# Patient Record
Sex: Female | Born: 1975 | Race: White | Hispanic: No | Marital: Married | Smoking: Never smoker
Health system: Southern US, Community
[De-identification: ages and names within clinical notes are randomized; demographics above are authoritative.]

## PROBLEM LIST (undated history)

## (undated) DIAGNOSIS — K219 Gastro-esophageal reflux disease without esophagitis: Secondary | ICD-10-CM

## (undated) HISTORY — DX: Gastro-esophageal reflux disease without esophagitis: K21.9

## (undated) HISTORY — PX: TUBAL LIGATION: SHX77

---

## 2009-01-23 ENCOUNTER — Ambulatory Visit: Payer: Self-pay | Admitting: Family Medicine

## 2009-09-15 ENCOUNTER — Ambulatory Visit: Payer: Self-pay | Admitting: Internal Medicine

## 2014-06-17 ENCOUNTER — Encounter: Payer: Self-pay | Admitting: *Deleted

## 2015-10-08 ENCOUNTER — Ambulatory Visit: Payer: Self-pay | Admitting: Physician Assistant

## 2015-12-08 ENCOUNTER — Other Ambulatory Visit: Payer: Self-pay | Admitting: Obstetrics and Gynecology

## 2015-12-08 DIAGNOSIS — Z1239 Encounter for other screening for malignant neoplasm of breast: Secondary | ICD-10-CM

## 2015-12-08 DIAGNOSIS — Z0000 Encounter for general adult medical examination without abnormal findings: Secondary | ICD-10-CM | POA: Diagnosis not present

## 2015-12-08 DIAGNOSIS — Z1389 Encounter for screening for other disorder: Secondary | ICD-10-CM | POA: Diagnosis not present

## 2015-12-08 DIAGNOSIS — Z01419 Encounter for gynecological examination (general) (routine) without abnormal findings: Secondary | ICD-10-CM | POA: Diagnosis not present

## 2015-12-30 ENCOUNTER — Ambulatory Visit: Payer: Self-pay

## 2015-12-30 ENCOUNTER — Ambulatory Visit
Admission: RE | Admit: 2015-12-30 | Discharge: 2015-12-30 | Disposition: A | Discharge status: Home / Self Care | Payer: 59 | Source: Ambulatory Visit | Attending: Obstetrics and Gynecology | Admitting: Obstetrics and Gynecology

## 2015-12-30 ENCOUNTER — Other Ambulatory Visit: Payer: Self-pay | Admitting: Obstetrics and Gynecology

## 2015-12-30 DIAGNOSIS — Z1239 Encounter for other screening for malignant neoplasm of breast: Secondary | ICD-10-CM

## 2015-12-30 DIAGNOSIS — Z1231 Encounter for screening mammogram for malignant neoplasm of breast: Secondary | ICD-10-CM | POA: Insufficient documentation

## 2016-12-21 ENCOUNTER — Other Ambulatory Visit: Payer: Self-pay | Admitting: Obstetrics and Gynecology

## 2016-12-21 DIAGNOSIS — Z01419 Encounter for gynecological examination (general) (routine) without abnormal findings: Secondary | ICD-10-CM | POA: Diagnosis not present

## 2016-12-21 DIAGNOSIS — Z1231 Encounter for screening mammogram for malignant neoplasm of breast: Secondary | ICD-10-CM

## 2017-01-13 ENCOUNTER — Ambulatory Visit
Admission: RE | Admit: 2017-01-13 | Discharge: 2017-01-13 | Disposition: A | Discharge status: Home / Self Care | Payer: 59 | Source: Ambulatory Visit | Attending: Obstetrics and Gynecology | Admitting: Obstetrics and Gynecology

## 2017-01-13 DIAGNOSIS — Z1231 Encounter for screening mammogram for malignant neoplasm of breast: Secondary | ICD-10-CM | POA: Diagnosis not present

## 2017-12-27 DIAGNOSIS — Z01419 Encounter for gynecological examination (general) (routine) without abnormal findings: Secondary | ICD-10-CM | POA: Diagnosis not present

## 2017-12-27 DIAGNOSIS — Z30011 Encounter for initial prescription of contraceptive pills: Secondary | ICD-10-CM | POA: Diagnosis not present

## 2017-12-28 ENCOUNTER — Other Ambulatory Visit: Payer: Self-pay | Admitting: Obstetrics and Gynecology

## 2017-12-28 DIAGNOSIS — Z1231 Encounter for screening mammogram for malignant neoplasm of breast: Secondary | ICD-10-CM

## 2018-01-16 ENCOUNTER — Ambulatory Visit
Admission: RE | Admit: 2018-01-16 | Discharge: 2018-01-16 | Disposition: A | Discharge status: Home / Self Care | Payer: 59 | Source: Ambulatory Visit | Attending: Obstetrics and Gynecology | Admitting: Obstetrics and Gynecology

## 2018-01-16 ENCOUNTER — Encounter (INDEPENDENT_AMBULATORY_CARE_PROVIDER_SITE_OTHER): Payer: Self-pay

## 2018-01-16 DIAGNOSIS — Z1231 Encounter for screening mammogram for malignant neoplasm of breast: Secondary | ICD-10-CM | POA: Diagnosis not present

## 2018-02-06 ENCOUNTER — Telehealth: Payer: 59 | Admitting: Family

## 2018-02-06 DIAGNOSIS — J208 Acute bronchitis due to other specified organisms: Secondary | ICD-10-CM

## 2018-02-06 DIAGNOSIS — B9689 Other specified bacterial agents as the cause of diseases classified elsewhere: Secondary | ICD-10-CM | POA: Diagnosis not present

## 2018-02-06 DIAGNOSIS — R05 Cough: Secondary | ICD-10-CM

## 2018-02-06 DIAGNOSIS — R059 Cough, unspecified: Secondary | ICD-10-CM

## 2018-02-06 MED ORDER — DOXYCYCLINE HYCLATE 100 MG PO TABS: 100.0000 mg | ORAL_TABLET | Freq: Two times a day (BID) | ORAL | 0 refills | Status: DC

## 2018-02-06 MED ORDER — PREDNISONE 10 MG (21) PO TBPK: ORAL_TABLET | ORAL | 0 refills | Status: DC

## 2018-02-06 NOTE — Progress Notes (Signed)
We are sorry that you are not feeling well.  Here is how we plan to help!  Based on your presentation I believe you most likely have A cough due to bacteria.  When patients have a fever and a productive cough with a change in color or increased sputum production, we are concerned about bacterial bronchitis.  If left untreated it can progress to pneumonia.  If your symptoms do not improve with your treatment plan it is important that you contact your provider.   I have prescribed Doxycycline 100 mg twice a day for 7 days     In addition you may use A non-prescription cough medication called Robitussin DAC. Take 2 teaspoons every 8 hours or Delsym: take 2 teaspoons every 12 hours. and A non-prescription cough medication called Mucinex DM: take 2 tablets every 12 hours.  Prednisone 10 mg daily for 6 days (see taper instructions below)  Directions for 6 day taper: Day 1: 2 tablets before breakfast, 1 after both lunch & dinner and 2 at bedtime Day 2: 1 tab before breakfast, 1 after both lunch & dinner and 2 at bedtime Day 3: 1 tab at each meal & 1 at bedtime Day 4: 1 tab at breakfast, 1 at lunch, 1 at bedtime Day 5: 1 tab at breakfast & 1 tab at bedtime Day 6: 1 tab at breakfast   From your responses in the eVisit questionnaire you describe inflammation in the upper respiratory tract which is causing a significant cough.  This is commonly called Bronchitis and has four common causes:    Allergies  Viral Infections  Acid Reflux  Bacterial Infection Allergies, viruses and acid reflux are treated by controlling symptoms or eliminating the cause. An example might be a cough caused by taking certain blood pressure medications. You stop the cough by changing the medication. Another example might be a cough caused by acid reflux. Controlling the reflux helps control the cough.  USE OF BRONCHODILATOR ("RESCUE") INHALERS: There is a risk from using your bronchodilator too frequently.  The risk is that  over-reliance on a medication which only relaxes the muscles surrounding the breathing tubes can reduce the effectiveness of medications prescribed to reduce swelling and congestion of the tubes themselves.  Although you feel brief relief from the bronchodilator inhaler, your asthma may actually be worsening with the tubes becoming more swollen and filled with mucus.  This can delay other crucial treatments, such as oral steroid medications. If you need to use a bronchodilator inhaler daily, several times per day, you should discuss this with your provider.  There are probably better treatments that could be used to keep your asthma under control.     HOME CARE . Only take medications as instructed by your medical team. . Complete the entire course of an antibiotic. . Drink plenty of fluids and get plenty of rest. . Avoid close contacts especially the very young and the elderly . Cover your mouth if you cough or cough into your sleeve. . Always remember to wash your hands . A steam or ultrasonic humidifier can help congestion.   GET HELP RIGHT AWAY IF: . You develop worsening fever. . You become short of breath . You cough up blood. . Your symptoms persist after you have completed your treatment plan MAKE SURE YOU   Understand these instructions.  Will watch your condition.  Will get help right away if you are not doing well or get worse.  Your e-visit answers were reviewed by  a board certified advanced clinical practitioner to complete your personal care plan.  Depending on the condition, your plan could have included both over the counter or prescription medications. If there is a problem please reply  once you have received a response from your provider. Your safety is important to us.  If you have drug allergies check your prescription carefully.    You can use MyChart to ask questions about today's visit, request a non-urgent call back, or ask for a work or school excuse for 24 hours  related to this e-Visit. If it has been greater than 24 hours you will need to follow up with your provider, or enter a new e-Visit to address those concerns. You will get an e-mail in the next two days asking about your experience.  I hope that your e-visit has been valuable and will speed your recovery. Thank you for using e-visits.

## 2019-01-15 ENCOUNTER — Other Ambulatory Visit: Payer: Self-pay | Admitting: Certified Nurse Midwife

## 2019-01-15 DIAGNOSIS — Z1151 Encounter for screening for human papillomavirus (HPV): Secondary | ICD-10-CM | POA: Diagnosis not present

## 2019-01-15 DIAGNOSIS — N920 Excessive and frequent menstruation with regular cycle: Secondary | ICD-10-CM | POA: Diagnosis not present

## 2019-01-15 DIAGNOSIS — R87619 Unspecified abnormal cytological findings in specimens from cervix uteri: Secondary | ICD-10-CM | POA: Diagnosis not present

## 2019-01-15 DIAGNOSIS — Z01419 Encounter for gynecological examination (general) (routine) without abnormal findings: Secondary | ICD-10-CM | POA: Diagnosis not present

## 2019-01-15 DIAGNOSIS — R87610 Atypical squamous cells of undetermined significance on cytologic smear of cervix (ASC-US): Secondary | ICD-10-CM | POA: Diagnosis not present

## 2019-01-15 DIAGNOSIS — Z13220 Encounter for screening for lipoid disorders: Secondary | ICD-10-CM | POA: Diagnosis not present

## 2019-01-15 DIAGNOSIS — Z131 Encounter for screening for diabetes mellitus: Secondary | ICD-10-CM | POA: Diagnosis not present

## 2019-01-15 DIAGNOSIS — Z1231 Encounter for screening mammogram for malignant neoplasm of breast: Secondary | ICD-10-CM

## 2019-01-30 ENCOUNTER — Ambulatory Visit
Admission: RE | Admit: 2019-01-30 | Discharge: 2019-01-30 | Disposition: A | Discharge status: Home / Self Care | Payer: 59 | Source: Ambulatory Visit | Attending: Certified Nurse Midwife | Admitting: Certified Nurse Midwife

## 2019-01-30 ENCOUNTER — Other Ambulatory Visit: Payer: Self-pay

## 2019-01-30 DIAGNOSIS — Z1231 Encounter for screening mammogram for malignant neoplasm of breast: Secondary | ICD-10-CM | POA: Diagnosis not present

## 2019-02-06 ENCOUNTER — Other Ambulatory Visit: Payer: Self-pay | Admitting: Certified Nurse Midwife

## 2019-02-06 DIAGNOSIS — N6489 Other specified disorders of breast: Secondary | ICD-10-CM

## 2019-02-06 DIAGNOSIS — R928 Other abnormal and inconclusive findings on diagnostic imaging of breast: Secondary | ICD-10-CM

## 2019-02-09 ENCOUNTER — Ambulatory Visit
Admission: RE | Admit: 2019-02-09 | Discharge: 2019-02-09 | Disposition: A | Discharge status: Home / Self Care | Payer: 59 | Source: Ambulatory Visit | Attending: Certified Nurse Midwife | Admitting: Certified Nurse Midwife

## 2019-02-09 DIAGNOSIS — R928 Other abnormal and inconclusive findings on diagnostic imaging of breast: Secondary | ICD-10-CM | POA: Insufficient documentation

## 2019-02-09 DIAGNOSIS — N6489 Other specified disorders of breast: Secondary | ICD-10-CM | POA: Diagnosis not present

## 2019-02-14 DIAGNOSIS — R87610 Atypical squamous cells of undetermined significance on cytologic smear of cervix (ASC-US): Secondary | ICD-10-CM | POA: Diagnosis not present

## 2019-02-14 DIAGNOSIS — B977 Papillomavirus as the cause of diseases classified elsewhere: Secondary | ICD-10-CM | POA: Diagnosis not present

## 2019-02-14 DIAGNOSIS — R87619 Unspecified abnormal cytological findings in specimens from cervix uteri: Secondary | ICD-10-CM | POA: Diagnosis not present

## 2019-02-14 DIAGNOSIS — N711 Chronic inflammatory disease of uterus: Secondary | ICD-10-CM | POA: Diagnosis not present

## 2019-02-14 DIAGNOSIS — N72 Inflammatory disease of cervix uteri: Secondary | ICD-10-CM | POA: Diagnosis not present

## 2019-06-28 DIAGNOSIS — Z20828 Contact with and (suspected) exposure to other viral communicable diseases: Secondary | ICD-10-CM | POA: Diagnosis not present

## 2019-06-28 DIAGNOSIS — Z03818 Encounter for observation for suspected exposure to other biological agents ruled out: Secondary | ICD-10-CM | POA: Diagnosis not present

## 2019-07-01 DIAGNOSIS — U071 COVID-19: Secondary | ICD-10-CM | POA: Diagnosis not present

## 2019-07-01 DIAGNOSIS — Z20828 Contact with and (suspected) exposure to other viral communicable diseases: Secondary | ICD-10-CM | POA: Diagnosis not present

## 2020-04-29 ENCOUNTER — Other Ambulatory Visit: Payer: Self-pay | Admitting: Obstetrics and Gynecology

## 2020-07-01 DIAGNOSIS — Z1231 Encounter for screening mammogram for malignant neoplasm of breast: Secondary | ICD-10-CM | POA: Diagnosis not present

## 2020-07-01 DIAGNOSIS — Z01419 Encounter for gynecological examination (general) (routine) without abnormal findings: Secondary | ICD-10-CM | POA: Diagnosis not present

## 2020-07-01 DIAGNOSIS — Z1329 Encounter for screening for other suspected endocrine disorder: Secondary | ICD-10-CM | POA: Diagnosis not present

## 2020-07-01 DIAGNOSIS — R87610 Atypical squamous cells of undetermined significance on cytologic smear of cervix (ASC-US): Secondary | ICD-10-CM | POA: Diagnosis not present

## 2020-07-01 DIAGNOSIS — Z124 Encounter for screening for malignant neoplasm of cervix: Secondary | ICD-10-CM | POA: Diagnosis not present

## 2020-07-01 DIAGNOSIS — Z131 Encounter for screening for diabetes mellitus: Secondary | ICD-10-CM | POA: Diagnosis not present

## 2020-07-01 DIAGNOSIS — Z13220 Encounter for screening for lipoid disorders: Secondary | ICD-10-CM | POA: Diagnosis not present

## 2020-07-17 ENCOUNTER — Other Ambulatory Visit: Payer: Self-pay | Admitting: Certified Nurse Midwife

## 2020-07-17 DIAGNOSIS — Z1231 Encounter for screening mammogram for malignant neoplasm of breast: Secondary | ICD-10-CM

## 2020-07-21 ENCOUNTER — Ambulatory Visit
Admission: RE | Admit: 2020-07-21 | Discharge: 2020-07-21 | Disposition: A | Discharge status: Home / Self Care | Payer: 59 | Source: Ambulatory Visit | Attending: Certified Nurse Midwife | Admitting: Certified Nurse Midwife

## 2020-07-21 ENCOUNTER — Other Ambulatory Visit: Payer: Self-pay

## 2020-07-21 DIAGNOSIS — Z1231 Encounter for screening mammogram for malignant neoplasm of breast: Secondary | ICD-10-CM | POA: Insufficient documentation

## 2021-01-09 ENCOUNTER — Emergency Department: Payer: 59

## 2021-01-09 ENCOUNTER — Other Ambulatory Visit: Payer: Self-pay

## 2021-01-09 ENCOUNTER — Encounter: Payer: Self-pay | Admitting: Emergency Medicine

## 2021-01-09 ENCOUNTER — Emergency Department
Admission: EM | Admit: 2021-01-09 | Discharge: 2021-01-09 | Disposition: A | Discharge status: Home / Self Care | Payer: 59 | Attending: Emergency Medicine | Admitting: Emergency Medicine

## 2021-01-09 DIAGNOSIS — R42 Dizziness and giddiness: Secondary | ICD-10-CM | POA: Diagnosis not present

## 2021-01-09 DIAGNOSIS — Z20822 Contact with and (suspected) exposure to covid-19: Secondary | ICD-10-CM | POA: Insufficient documentation

## 2021-01-09 DIAGNOSIS — R002 Palpitations: Secondary | ICD-10-CM | POA: Diagnosis not present

## 2021-01-09 DIAGNOSIS — R110 Nausea: Secondary | ICD-10-CM | POA: Insufficient documentation

## 2021-01-09 DIAGNOSIS — R000 Tachycardia, unspecified: Secondary | ICD-10-CM | POA: Diagnosis not present

## 2021-01-09 LAB — CBC
HCT: 38.9 % (ref 36.0–46.0)
Hemoglobin: 13.1 g/dL (ref 12.0–15.0)
MCH: 28.9 pg (ref 26.0–34.0)
MCHC: 33.7 g/dL (ref 30.0–36.0)
MCV: 85.7 fL (ref 80.0–100.0)
Platelets: 425 10*3/uL — ABNORMAL HIGH (ref 150–400)
RBC: 4.54 MIL/uL (ref 3.87–5.11)
RDW: 12.2 % (ref 11.5–15.5)
WBC: 14.4 10*3/uL — ABNORMAL HIGH (ref 4.0–10.5)
nRBC: 0 % (ref 0.0–0.2)

## 2021-01-09 LAB — BASIC METABOLIC PANEL
Anion gap: 9 (ref 5–15)
BUN: 12 mg/dL (ref 6–20)
CO2: 25 mmol/L (ref 22–32)
Calcium: 9.3 mg/dL (ref 8.9–10.3)
Chloride: 105 mmol/L (ref 98–111)
Creatinine, Ser: 0.73 mg/dL (ref 0.44–1.00)
GFR, Estimated: >60 mL/min (ref >60)
Glucose, Bld: 128 mg/dL — ABNORMAL HIGH (ref 70–99)
Potassium: 3.5 mmol/L (ref 3.5–5.1)
Sodium: 139 mmol/L (ref 135–145)

## 2021-01-09 LAB — RESP PANEL BY RT-PCR (FLU A&B, COVID) ARPGX2
Influenza A by PCR: NEGATIVE
Influenza B by PCR: NEGATIVE
SARS Coronavirus 2 by RT PCR: NEGATIVE

## 2021-01-09 LAB — TROPONIN I (HIGH SENSITIVITY)
Troponin I (High Sensitivity): <2 ng/L (ref <18)
Troponin I (High Sensitivity): <2 ng/L (ref <18)

## 2021-01-09 LAB — D-DIMER, QUANTITATIVE: D-Dimer, Quant: 0.3 ug/mL-FEU (ref 0.00–0.50)

## 2021-01-09 MED ORDER — LACTATED RINGERS IV BOLUS
1000.0000 mL | Freq: Once | INTRAVENOUS | Status: AC
  Administered 2021-01-09: 1000 mL via INTRAVENOUS

## 2021-01-09 MED ORDER — ONDANSETRON 4 MG PO TBDP
4.0000 mg | ORAL_TABLET | Freq: Three times a day (TID) | ORAL | 0 refills | Status: DC | PRN
  Filled 2021-01-09: qty 20, 7d supply, fill #0

## 2021-01-09 NOTE — ED Provider Notes (Signed)
[facility] Emergency Department Provider Note ____________________________________________   Event Date/Time   First MD Initiated Contact with Patient 01/09/21 [phone]     (approximate)  I have reviewed the triage vital signs and the nursing notes.  HISTORY  Chief Complaint Palpitations and Dizziness   HPI Jessica Franklin is a 45 y.o. femalewho presents to the ED for evaluation of dizziness and palpitations.   Chart review indicates no relevant history.  Patient presents to the ED, accompanied by her good friend and work Animator, for evaluation of dizziness and palpitations.  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  She reports feeling "off" throughout this week with nausea, poor appetite and poor p.o. intake.  Denies abdominal pain, emesis, diarrhea or fever.  She reports poor sleep and excessive caffeine use while working night shifts last night.  At work last night, around 3 AM, she reports developing palpitations and presyncopal dizziness after standing.  She checked her heart rate on 5-lead telemetry and reports rates up to 130.  She shows me on her phone a rhythm strip from her smart watch demonstrating what appears to be sinus tachycardic rhythm with a rate of 101 bpm.  Due to palpitations and this heart rate while at work this morning, she presents to the ED for evaluation.  She denies any chest pain, syncope, shortness of breath, falls or injuries.  Reports feeling okay right now.  History reviewed. No pertinent past medical history.  There are no problems to display for this patient.   Past Surgical History:  Procedure Laterality Date   TUBAL LIGATION      Prior to Admission medications   Medication Sig Start Date End Date Taking? Authorizing Provider  ondansetron (ZOFRAN ODT) 4 MG disintegrating tablet Take 1 tablet (4 mg total) by mouth every 8 (eight) hours as needed for nausea or vomiting. 01/09/21  Yes Delton Prairie, MD   doxycycline (VIBRA-TABS) 100 MG tablet Take 1 tablet (100 mg total) by mouth 2 (two) times daily. 02/06/18   Junie Spencer, FNP  predniSONE (STERAPRED UNI-PAK 21 TAB) 10 MG (21) TBPK tablet Use as directed 02/06/18   Junie Spencer, FNP    Allergies Penicillins  Family History  Problem Relation Age of Onset   Breast cancer Neg Hx     Social History Social History   Tobacco Use   Smoking status: Never   Smokeless tobacco: Never  Vaping Use   Vaping Use: Every day  Substance Use Topics   Alcohol use: Yes   Drug use: Not Currently    Review of Systems  Constitutional: No fever/chills.  Positive for dizziness Eyes: No visual changes. ENT: No sore throat. Cardiovascular: Denies chest pain.  Positive for palpitations Respiratory: Denies shortness of breath. Gastrointestinal: No abdominal pain.  No nausea, no vomiting.  No diarrhea.  No constipation. Genitourinary: Negative for dysuria. Musculoskeletal: Negative for back pain. Skin: Negative for rash. Neurological: Negative for headaches, focal weakness or numbness.  ____________________________________________   PHYSICAL EXAM:  VITAL SIGNS: Vitals:   01/09/21 1030 01/09/21 1100  BP: 120/83 115/77  Pulse: 90 86  Resp: 15 13  Temp:    SpO2: 97% 98%    Constitutional: Alert and oriented. Well appearing and in no acute distress. Eyes: Conjunctivae are normal. PERRL. EOMI. Head: Atraumatic. Nose: No congestion/rhinnorhea. Mouth/Throat: Mucous membranes are moist.  Oropharynx non-erythematous. Neck: No stridor. No cervical spine tenderness to palpation. Cardiovascular: Normal rate, regular rhythm. Grossly normal heart sounds.  Good peripheral circulation. Respiratory: Normal respiratory effort.  No retractions. Lungs CTAB. Gastrointestinal: Soft , nondistended, nontender to palpation. No CVA tenderness. Musculoskeletal: No lower extremity tenderness nor edema.  No joint effusions. No signs of acute  trauma. Neurologic:  Normal speech and language. No gross focal neurologic deficits are appreciated. No gait instability noted. Skin:  Skin is warm, dry and intact. No rash noted. Psychiatric: Mood and affect are normal. Speech and behavior are normal. ____________________________________________   LABS (all labs ordered are listed, but only abnormal results are displayed)  Labs Reviewed  BASIC METABOLIC PANEL - Abnormal; Notable for the following components:      Result Value   Glucose, Bld 128 (*)    All other components within normal limits  CBC - Abnormal; Notable for the following components:   WBC 14.4 (*)    Platelets 425 (*)    All other components within normal limits  RESP PANEL BY RT-PCR (FLU A&B, COVID) [id number]  D-DIMER, QUANTITATIVE  POC URINE PREG, ED  TROPONIN I (HIGH SENSITIVITY)  TROPONIN I (HIGH SENSITIVITY)   ____________________________________________  12 Lead EKG  Sinus tachycardic rhythm with a rate of 108 bpm.  Normal axis and intervals.  T wave inversions isolated to lead III.  No further evidence of acute ischemia. ____________________________________________  RADIOLOGY  ED MD interpretation:  CXR reviewed by me without evidence of acute cardiopulmonary pathology.  Official radiology report(s): DG Chest 2 View  Result Date: 01/09/2021 CLINICAL DATA:  45 year old female with history of heart palpitations. EXAM: CHEST - 2 VIEW COMPARISON:  No priors. FINDINGS: Lung volumes are normal. No consolidative airspace disease. No pleural effusions. No pneumothorax. No pulmonary nodule or mass noted. Pulmonary vasculature and the cardiomediastinal silhouette are within normal limits. IMPRESSION: No radiographic evidence of acute cardiopulmonary disease. Electronically Signed   By: Trudie Reed M.D.   On: 01/09/2021 07:44    ____________________________________________   PROCEDURES and INTERVENTIONS  Procedure(s) performed (including Critical  Care):  .1-3 Lead EKG Interpretation Performed by: Delton Prairie, MD Authorized by: Delton Prairie, MD     Interpretation: normal     ECG rate:  92   ECG rate assessment: normal     Rhythm: sinus rhythm     Ectopy: none     Conduction: normal    Medications  lactated ringers bolus 1,000 mL (1,000 mLs Intravenous New Bag/Given 01/09/21 0956)    ____________________________________________   MDM / ED COURSE   Healthy 45 year old female presents to the ED with palpitations, without evidence of acute pathology, and amenable to outpatient management.  Normal vitals on room air.  She is clinically well-appearing without evidence of acute pathology on examination.  No signs of neurologic or vascular deficits.  No chest pain.  No evidence of ACS and D-dimer rules out acute PE in this low risk patient.  No signs of underlying COVID or flu to cause her to feel poorly with poor appetite and nausea.  We will discharge with referral to cardiology for possible Holter monitor.  Discussed return precautions for the ED.  Clinical Course as of 01/09/21 1133  Fri Jan 09, 2021  1130 Reassessed.  Remains asymptomatic.  No dysrhythmias noted on telemetry.  I explained benign work-up to her and discussed outpatient management, referral to cardiology to discuss Holter monitor and return precautions for the ED. [DS]    Clinical Course User Index [DS] Delton Prairie, MD    ____________________________________________   FINAL CLINICAL IMPRESSION(S) / ED DIAGNOSES  Final diagnoses:  Palpitations  Dizziness  Nausea     ED Discharge Orders          Ordered    ondansetron (ZOFRAN ODT) 4 MG disintegrating tablet  Every 8 hours PRN        01/09/21 1126             Eliyah Bazzi   Note:  This document was prepared using Conservation officer, historic buildings and may include unintentional dictation errors.    Delton Prairie, MD 01/09/21 1135

## 2021-01-09 NOTE — ED Triage Notes (Signed)
Pt comes into the ED via POV c/o palpitations and dizziness.  Pt states that she hasn't felt good all week and had an episode of palpitations the other day but is resolved.  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  Pt states she did have dizziness and nausea when this occurred.  Pt denies any known cardiac history.

## 2021-01-09 NOTE — ED Notes (Signed)
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 Pt began feeling heart palpitations, "fluttering" at 0300 when walking out of pt room and put her self on 5 lead monitor and it read SVT with HR in 130s.   Pt is still feeling some fluttering, but it feels "different". Pt has been experiencing nausea and dizziness on and off for 5-7 days and feels "waves of dizziness and nausea" intermittently today since 0300.   Has not been able to eat this week, did not eat anything at all on Sunday. Had sore throat 1 week ago (Friday) which resolved. Denies chest pain. Denies SOB. At this moment, pt states feels flutters and monitor reads NSR at 89 HR. States she drank 2 mountain dew sodas last night and had not eaten much.  Not pregnant, is on period now and had tubal ligation 20 years ago. Accompanied by friend who is nurse.

## 2021-01-13 ENCOUNTER — Other Ambulatory Visit: Payer: Self-pay

## 2021-01-13 ENCOUNTER — Ambulatory Visit
Admission: RE | Admit: 2021-01-13 | Discharge: 2021-01-13 | Disposition: A | Discharge status: Home / Self Care | Payer: 59 | Source: Ambulatory Visit | Attending: Internal Medicine | Admitting: Internal Medicine

## 2021-01-13 VITALS — BP 137/83 | HR 76 | Temp 98.6°F | Resp 16

## 2021-01-13 DIAGNOSIS — R42 Dizziness and giddiness: Secondary | ICD-10-CM

## 2021-01-13 DIAGNOSIS — H6993 Unspecified Eustachian tube disorder, bilateral: Secondary | ICD-10-CM

## 2021-01-13 DIAGNOSIS — H6983 Other specified disorders of Eustachian tube, bilateral: Secondary | ICD-10-CM

## 2021-01-13 LAB — URINALYSIS, COMPLETE (UACMP) WITH MICROSCOPIC
Bilirubin Urine: NEGATIVE
Glucose, UA: NEGATIVE mg/dL
Hgb urine dipstick: NEGATIVE
Ketones, ur: NEGATIVE mg/dL
Leukocytes,Ua: NEGATIVE
Nitrite: NEGATIVE
Protein, ur: NEGATIVE mg/dL
Specific Gravity, Urine: <1.005 — ABNORMAL LOW (ref 1.005–1.030)
pH: 6 (ref 5.0–8.0)

## 2021-01-13 MED ORDER — MECLIZINE HCL 25 MG PO TABS: 25.0000 mg | ORAL_TABLET | Freq: Three times a day (TID) | ORAL | 0 refills | Status: DC | PRN

## 2021-01-13 NOTE — ED Provider Notes (Signed)
[facility]    CSN: [id number] Arrival date & time: 01/13/21  1254      History   Chief Complaint Chief Complaint  Patient presents with   Dizziness   Otalgia   Appointment    1300     HPI Jessica Franklin is a 45 y.o. female presenting for dizziness for the past week or so.  Patient reports that she had cough, congestion and sore throat and general upper respiratory type symptoms right before the dizziness started.  She reports feeling like her head is swimmy and she feels lightheaded.  Nothing makes dizziness better or worse.  She did actually go to the emergency department 4 days ago for the dizziness.  Patient reports she started to experience palpitations and tachycardia.  She performed an EKG on herself while at work and says that it showed SVT.  She became concerned so she went immediately to the emergency department.  They performed an EKG which was normal and did thorough work-up and referred her to a cardiologist.  She has a cardiology appointment early next week.  Patient reports that the dizziness has improved but she is concerned because it has been ongoing for about a week.  She denies any ear pain or drainage.  No hearing changes or headaches.  Cough has resolved.  No chest pain or breathing difficulty.  Denies any recent palpitations.  Patient reports that she stopped drinking caffeine and vaping when she started to have the tachycardia.  Patient is otherwise healthy without any chronic medical problems.  She has no other complaints.  HPI  History reviewed. No pertinent past medical history.  There are no problems to display for this patient.   Past Surgical History:  Procedure Laterality Date   TUBAL LIGATION      OB History   No obstetric history on file.      Home Medications    Prior to Admission medications   Medication Sig Start Date End Date Taking? Authorizing Provider  meclizine (ANTIVERT) 25 MG tablet Take 1 tablet (25 mg total) by  mouth 3 (three) times daily as needed for dizziness. 01/13/21  Yes Shirlee Latch, PA-C    Family History Family History  Problem Relation Age of Onset   Breast cancer Neg Hx     Social History Social History   Tobacco Use   Smoking status: Never   Smokeless tobacco: Never  Vaping Use   Vaping Use: Former   Start date: 01/09/2021  Substance Use Topics   Alcohol use: Yes   Drug use: Not Currently     Allergies   Penicillins   Review of Systems Review of Systems  Constitutional:  Negative for chills, diaphoresis, fatigue and fever.  HENT:  Positive for congestion. Negative for ear pain, rhinorrhea, sinus pressure, sinus pain and sore throat.   Respiratory:  Negative for cough and shortness of breath.   Cardiovascular:  Negative for chest pain and palpitations.  Gastrointestinal:  Negative for abdominal pain, nausea and vomiting.  Genitourinary:  Negative for dysuria and frequency.  Musculoskeletal:  Negative for arthralgias and myalgias.  Skin:  Negative for rash.  Neurological:  Positive for dizziness and light-headedness. Negative for syncope, weakness, numbness and headaches.  Hematological:  Negative for adenopathy.    Physical Exam Triage Vital Signs ED Triage Vitals  Enc Vitals Group     BP 01/13/21 1320 137/83     Pulse Rate 01/13/21 1320 76     Resp 01/13/21 1320  16     Temp 01/13/21 1320 98.6 F (37 C)     Temp Source 01/13/21 1320 Oral     SpO2 01/13/21 1320 98 %     Weight --      Height --      Head Circumference --      Peak Flow --      Pain Score 01/13/21 1318 0     Pain Loc --      Pain Edu? --      Excl. in GC? --    No data found.  Updated Vital Signs BP 137/83 (BP Location: Left Arm)   Pulse 76   Temp 98.6 F (37 C) (Oral)   Resp 16   LMP 01/05/2021   SpO2 98%      Physical Exam Vitals and nursing note reviewed.  Constitutional:      General: She is not in acute distress.    Appearance: Normal appearance. She is not  ill-appearing or toxic-appearing.  HENT:     Head: Normocephalic and atraumatic.     Right Ear: Hearing, ear canal and external ear normal. A middle ear effusion is present. Tympanic membrane is not erythematous, retracted or bulging.     Left Ear: Hearing, ear canal and external ear normal. A middle ear effusion is present. Tympanic membrane is not erythematous, retracted or bulging.     Nose: Mucosal edema and congestion present.     Mouth/Throat:     Mouth: Mucous membranes are moist.     Pharynx: Oropharynx is clear.  Eyes:     General: No scleral icterus.       Right eye: No discharge.        Left eye: No discharge.     Conjunctiva/sclera: Conjunctivae normal.  Cardiovascular:     Rate and Rhythm: Normal rate and regular rhythm.     Heart sounds: Normal heart sounds.  Pulmonary:     Effort: Pulmonary effort is normal. No respiratory distress.     Breath sounds: Normal breath sounds.  Musculoskeletal:     Cervical back: Neck supple.  Skin:    General: Skin is dry.  Neurological:     General: No focal deficit present.     Mental Status: She is alert. Mental status is at baseline.     Motor: No weakness.     Gait: Gait normal.  Psychiatric:        Mood and Affect: Mood normal.        Behavior: Behavior normal.        Thought Content: Thought content normal.     UC Treatments / Results  Labs (all labs ordered are listed, but only abnormal results are displayed) Labs Reviewed  URINALYSIS, COMPLETE (UACMP) WITH MICROSCOPIC - Abnormal; Notable for the following components:      Result Value   Specific Gravity, Urine <1.005 (*)    Bacteria, UA RARE (*)    All other components within normal limits    EKG   Radiology No results found.  Procedures Procedures (including critical care time)  Medications Ordered in UC Medications - No data to display  Initial Impression / Assessment and Plan / UC Course  I have reviewed the triage vital signs and the nursing  notes.  Pertinent labs & imaging results that were available during my care of the patient were reviewed by me and considered in my medical decision making (see chart for details).  45 year old female presenting for approximately 1  week history of dizziness.  Patient had upper respiratory infection right before the dizziness started.  Patient seen in ED 4 days ago.  I was able to review the notes.  Patient had EKG, BMP, CBC, troponin, chest x-ray, D-dimer and respiratory panel.  Work-up only significant for elevated WBC count at 14.   Today, vital signs are normal and stable and she is overall well-appearing.  Exam does reveal effusions of bilateral TMs.  She also has minor nasal congestion.  Chest clear to auscultation heart regular rate and rhythm.  Patient says she did not have her ears checked at the ER few days ago.  Advised patient I believe her symptoms of dizziness are likely related to the effusions and eustachian tube dysfunction which is secondary to her viral upper respiratory infection.  Treating patient at this time with meclizine.  I have also advised nasal saline and Flonase.  Patient reported occasional abdominal cramping so a urinalysis was checked which was within normal limits.  Reviewed return and ED precautions with patient.  Final Clinical Impressions(s) / UC Diagnoses   Final diagnoses:  Dizziness  Dysfunction of both eustachian tubes     Discharge Instructions      -You have fluid behind both of your eardrums.  I am sure this is the cause of your dizziness.  Use Flonase every day.  I have sent meclizine which should help to dry up some of the fluid and help with the dizziness. - Increase rest and fluids. - Keep appointment with your cardiologist on Monday since you did have possible SVT. - You can do nicotine patches or gum if needed since you have stopped vaping. - If you experience any worsening of the dizziness, chest pain, palpitations, shortness of breath or  weakness he should be seen again immediately.  Please go to emergency department if that occurs.     ED Prescriptions     Medication Sig Dispense Auth. Provider   meclizine (ANTIVERT) 25 MG tablet Take 1 tablet (25 mg total) by mouth 3 (three) times daily as needed for dizziness. 30 tablet Gareth Morgan      PDMP not reviewed this encounter.   Shirlee Latch, PA-C 01/13/21 1424

## 2021-01-13 NOTE — Discharge Instructions (Signed)
-  You have fluid behind both of your eardrums.  I am sure this is the cause of your dizziness.  Use Flonase every day.  I have sent meclizine which should help to dry up some of the fluid and help with the dizziness. - Increase rest and fluids. - Keep appointment with your cardiologist on Monday since you did have possible SVT. - You can do nicotine patches or gum if needed since you have stopped vaping. - If you experience any worsening of the dizziness, chest pain, palpitations, shortness of breath or weakness he should be seen again immediately.  Please go to emergency department if that occurs.

## 2021-01-13 NOTE — ED Triage Notes (Addendum)
Patient presents to Urgent Care with complaints of continued dizziness  since last ED visit 11/04. Left ear ringing since Friday. Pt also states she has had some lower back pain and abdominal cramping believes related to ovulation.   Denies fever, hematuria, dysuria or cloudy urine.

## 2021-01-19 DIAGNOSIS — I471 Supraventricular tachycardia: Secondary | ICD-10-CM | POA: Diagnosis not present

## 2021-01-19 DIAGNOSIS — F17200 Nicotine dependence, unspecified, uncomplicated: Secondary | ICD-10-CM | POA: Diagnosis not present

## 2021-01-19 DIAGNOSIS — R002 Palpitations: Secondary | ICD-10-CM | POA: Diagnosis not present

## 2021-01-19 DIAGNOSIS — R000 Tachycardia, unspecified: Secondary | ICD-10-CM | POA: Diagnosis not present

## 2021-01-28 DIAGNOSIS — R002 Palpitations: Secondary | ICD-10-CM | POA: Diagnosis not present

## 2021-01-28 DIAGNOSIS — I471 Supraventricular tachycardia: Secondary | ICD-10-CM | POA: Diagnosis not present

## 2021-02-02 DIAGNOSIS — R002 Palpitations: Secondary | ICD-10-CM | POA: Diagnosis not present

## 2021-02-02 DIAGNOSIS — I471 Supraventricular tachycardia: Secondary | ICD-10-CM | POA: Diagnosis not present

## 2021-02-02 DIAGNOSIS — R000 Tachycardia, unspecified: Secondary | ICD-10-CM | POA: Diagnosis not present

## 2021-02-02 DIAGNOSIS — F17200 Nicotine dependence, unspecified, uncomplicated: Secondary | ICD-10-CM | POA: Diagnosis not present

## 2021-06-06 LAB — RESULTS CONSOLE HPV: CHL HPV: NEGATIVE

## 2021-06-06 LAB — HM PAP SMEAR: HM Pap smear: NORMAL

## 2021-07-29 DIAGNOSIS — R002 Palpitations: Secondary | ICD-10-CM | POA: Diagnosis not present

## 2021-07-29 DIAGNOSIS — R000 Tachycardia, unspecified: Secondary | ICD-10-CM | POA: Diagnosis not present

## 2021-07-29 DIAGNOSIS — F17200 Nicotine dependence, unspecified, uncomplicated: Secondary | ICD-10-CM | POA: Diagnosis not present

## 2021-07-29 DIAGNOSIS — R42 Dizziness and giddiness: Secondary | ICD-10-CM | POA: Diagnosis not present

## 2021-07-29 DIAGNOSIS — I471 Supraventricular tachycardia: Secondary | ICD-10-CM | POA: Diagnosis not present

## 2021-07-29 DIAGNOSIS — J069 Acute upper respiratory infection, unspecified: Secondary | ICD-10-CM | POA: Diagnosis not present

## 2021-08-09 IMAGING — MG MM DIGITAL DIAGNOSTIC UNILAT*R* W/ TOMO W/ CAD
4 series · 4 of 12 positions shown · non-contrast
Comparison: Previous exam(s).

CLINICAL DATA: Screening recall for a right breast asymmetry.

EXAM:
DIGITAL DIAGNOSTIC UNILATERAL RIGHT MAMMOGRAM WITH CAD AND TOMO

[R MLO synth-2D]
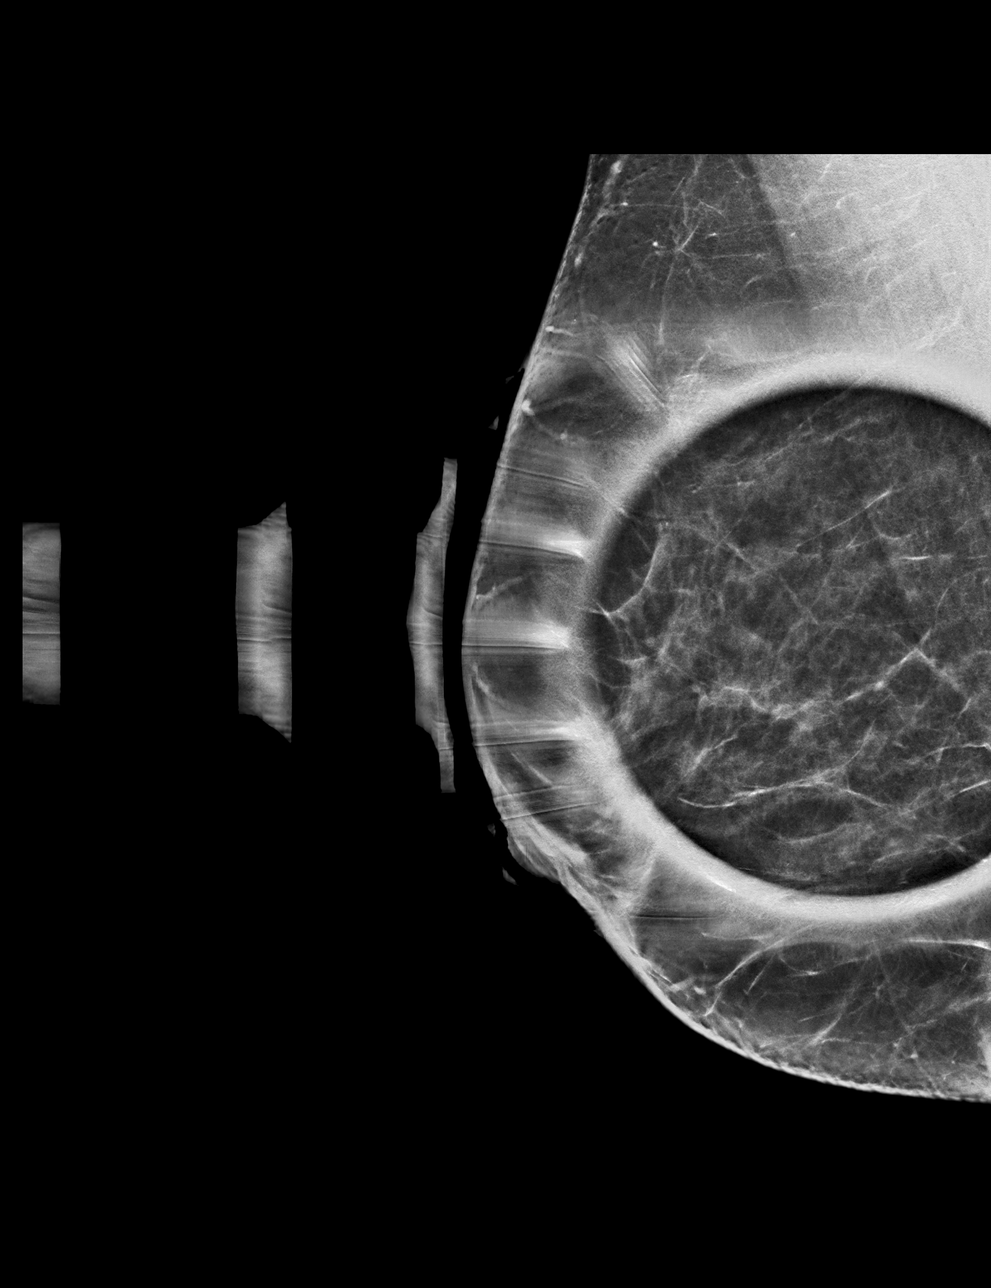

[R CC synth-2D]
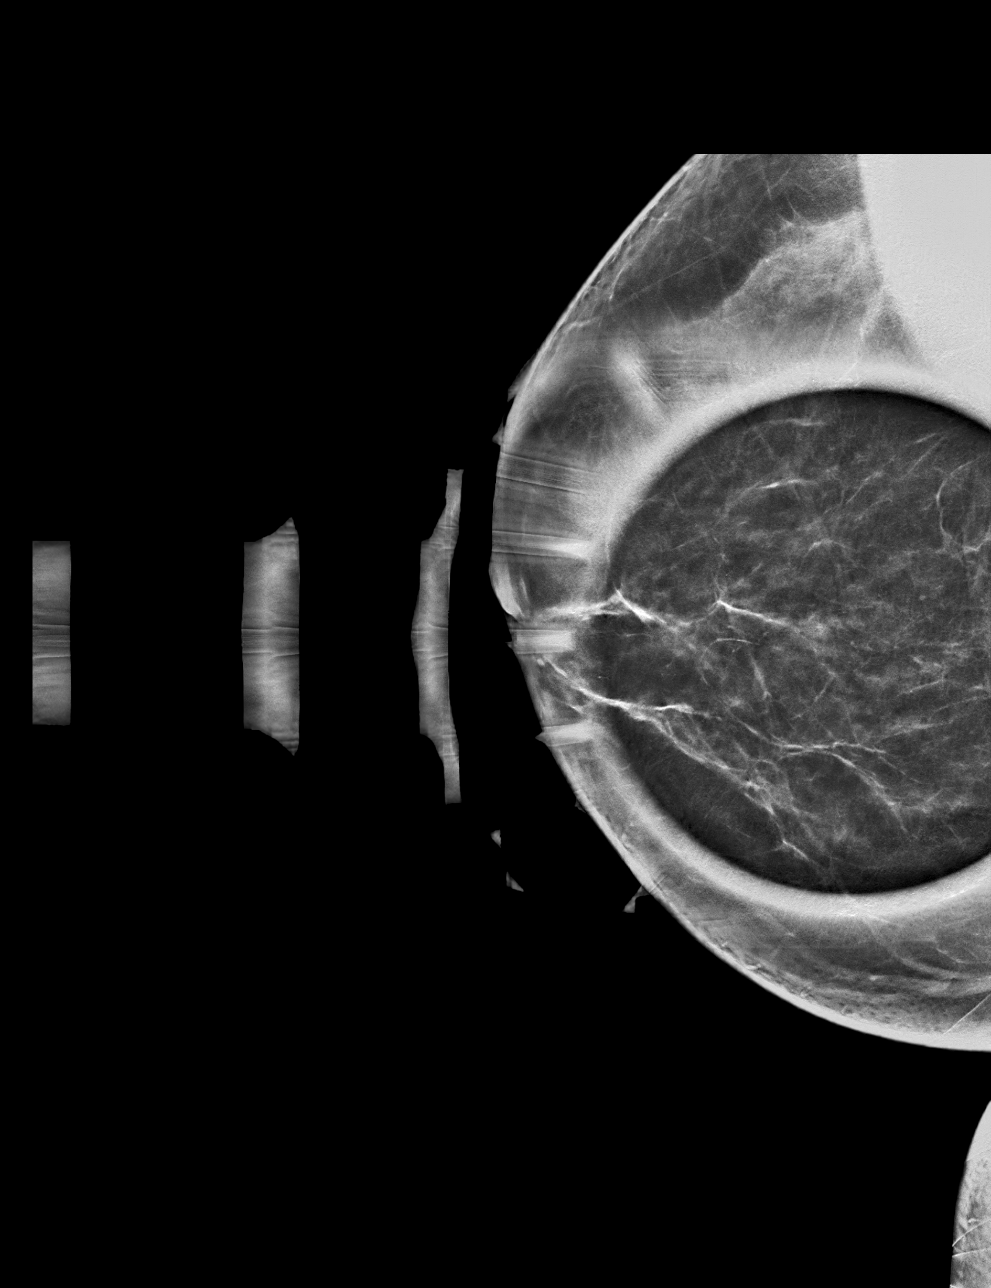

[R CC tomo · tomo slice 29/57.0]
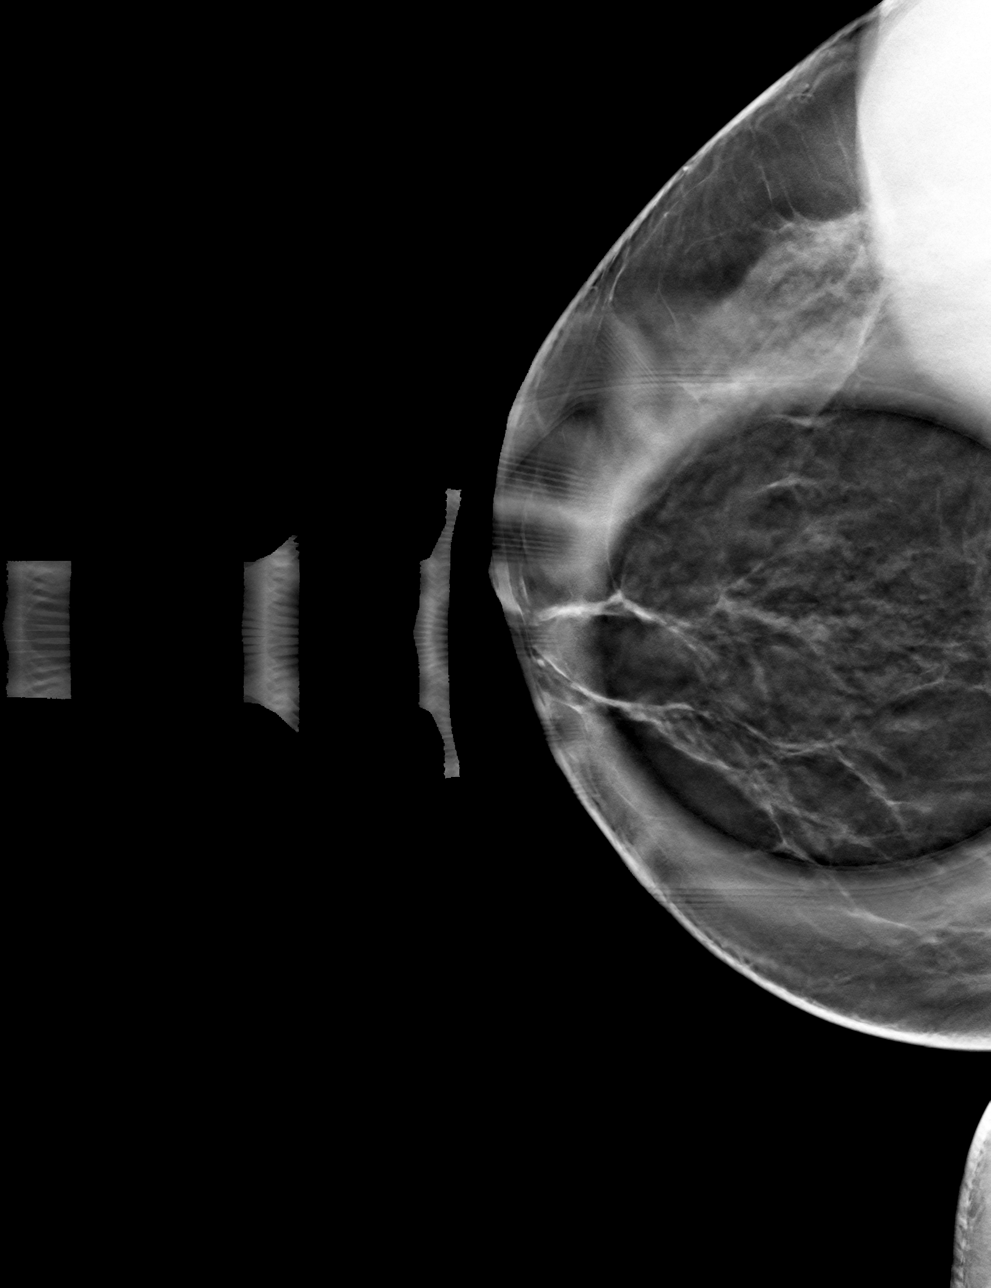

[R MLO tomo · tomo slice 31/62.0]
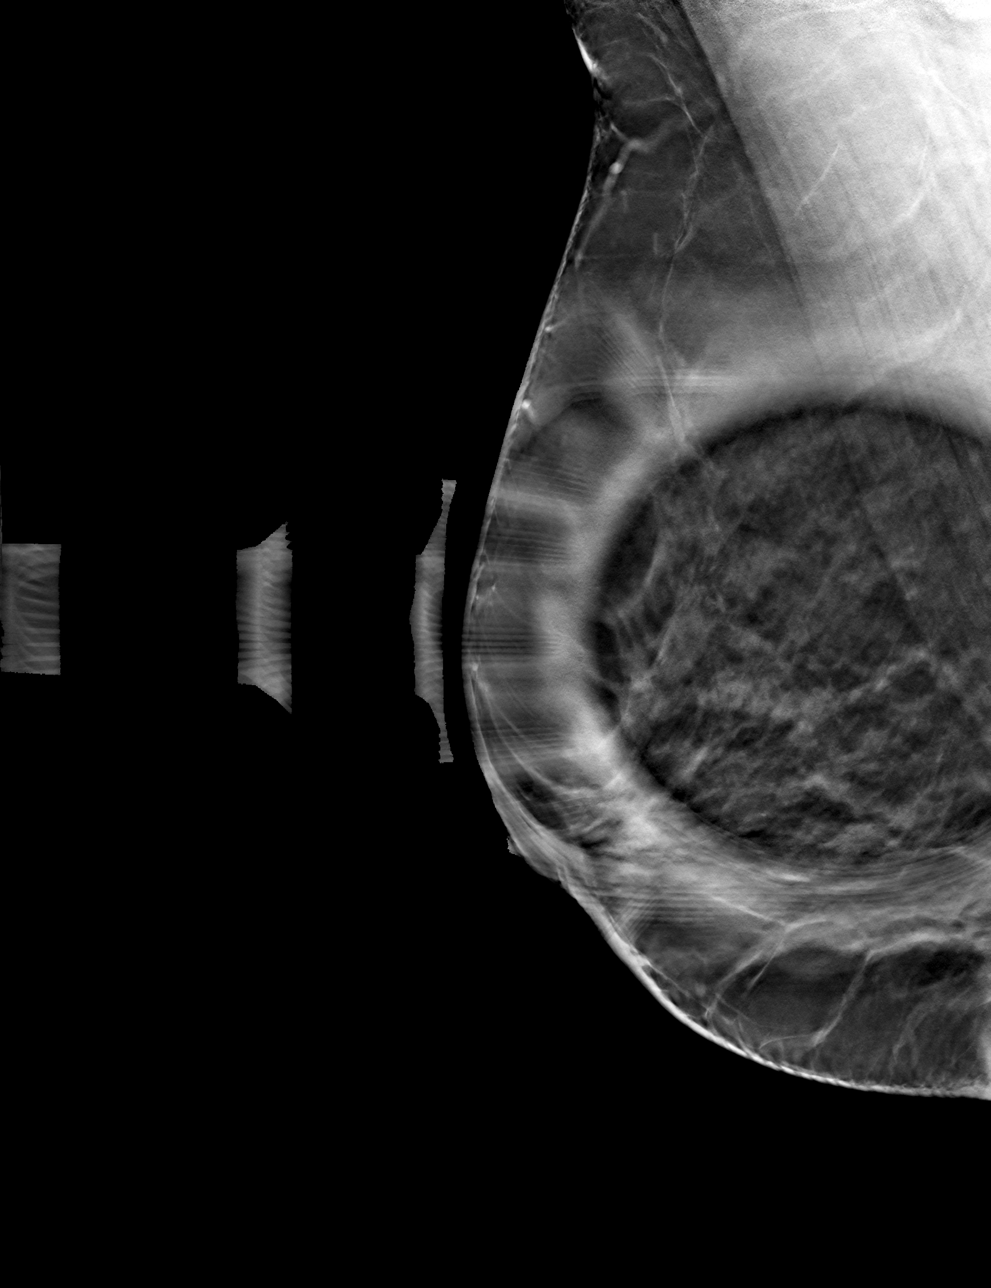

[4 of 12 positions shown; findings below may reference images not displayed]

ACR Breast Density Category c: The breast tissue is heterogeneously
dense, which may obscure small masses.
FINDINGS: The asymmetry in the central right breast resolves on spot
compression tomosynthesis imaging. No suspicious calcifications,
masses or areas of distortion are seen in the right breast.

Mammographic images were processed with CAD.
IMPRESSION: Resolution of the right breast asymmetry consistent with overlapping
fibroglandular tissue.

RECOMMENDATION:
Screening mammogram in one year.(Code:[id number]-U-[id number])

I have discussed the findings and recommendations with the patient.
If applicable, a reminder letter will be sent to the patient
regarding the next appointment.

BI-RADS CATEGORY  1: Negative.

## 2021-08-13 ENCOUNTER — Encounter: Payer: Self-pay | Admitting: Family Medicine

## 2021-08-13 ENCOUNTER — Ambulatory Visit: Payer: 59 | Admitting: Family Medicine

## 2021-08-13 VITALS — BP 128/70 | HR 80 | Ht 64.0 in | Wt 148.0 lb

## 2021-08-13 DIAGNOSIS — Z1231 Encounter for screening mammogram for malignant neoplasm of breast: Secondary | ICD-10-CM | POA: Diagnosis not present

## 2021-08-13 DIAGNOSIS — Z8679 Personal history of other diseases of the circulatory system: Secondary | ICD-10-CM | POA: Diagnosis not present

## 2021-08-13 NOTE — Progress Notes (Signed)
Date:  08/13/2021   Name:  Jessica Franklin   DOB:  [birth year]   MRN:  [id number]   Chief Complaint: No chief complaint on file.  Patient is a 46 year old female who presents for a comprehensive physical exam. The patient reports the following problems: . Health maintenance has been reviewed this mammogram reordered.      Lab Results  Component Value Date   NA 139 01/09/2021   K 3.5 01/09/2021   CO2 25 01/09/2021   GLUCOSE 128 (H) 01/09/2021   BUN 12 01/09/2021   CREATININE 0.73 01/09/2021   CALCIUM 9.3 01/09/2021   GFRNONAA >60 01/09/2021   No results found for: "CHOL", "HDL", "LDLCALC", "LDLDIRECT", "TRIG", "CHOLHDL" No results found for: "TSH" No results found for: "HGBA1C" Lab Results  Component Value Date   WBC 14.4 (H) 01/09/2021   HGB 13.1 01/09/2021   HCT 38.9 01/09/2021   MCV 85.7 01/09/2021   PLT 425 (H) 01/09/2021   No results found for: "ALT", "AST", "GGT", "ALKPHOS", "BILITOT" No results found for: "25OHVITD2", "25OHVITD3", "VD25OH"   Review of Systems  Constitutional:  Negative for diaphoresis, fatigue, fever and unexpected weight change.  HENT:  Negative for congestion, nosebleeds, postnasal drip, rhinorrhea, sinus pressure and sinus pain.   Respiratory:  Negative for chest tightness and shortness of breath.   Cardiovascular:  Negative for chest pain, palpitations and leg swelling.  Gastrointestinal:  Negative for abdominal pain, blood in stool, constipation and diarrhea.  Genitourinary:  Negative for menstrual problem and pelvic pain.    There are no problems to display for this patient.   Allergies  Allergen Reactions   Penicillins Hives    Past Surgical History:  Procedure Laterality Date   TUBAL LIGATION      Social History   Tobacco Use   Smoking status: Never   Smokeless tobacco: Never  Vaping Use   Vaping Use: Former   Start date: 01/09/2021  Substance Use Topics   Alcohol use: Yes   Drug use: Not Currently     Medication  list has been reviewed and updated.  No outpatient medications have been marked as taking for the 08/13/21 encounter (Appointment) with Duanne Limerick, MD.        No data to display              No data to display          BP Readings from Last 3 Encounters:  01/13/21 137/83  01/09/21 115/77    Physical Exam Vitals and nursing note reviewed.  HENT:     Right Ear: Tympanic membrane normal.     Left Ear: Tympanic membrane normal.     Nose: Nose normal. No congestion or rhinorrhea.  Cardiovascular:     Heart sounds: No murmur heard.    No gallop.  Pulmonary:     Effort: No respiratory distress.     Breath sounds: No stridor. No wheezing, rhonchi or rales.  Chest:     Chest wall: No tenderness.  Abdominal:     Tenderness: There is no abdominal tenderness. There is no guarding or rebound.     Wt Readings from Last 3 Encounters:  01/09/21 138 lb (62.6 kg)    There were no vitals taken for this visit.  Assessment and Plan: Patient establishing care with new physician.  Patient has up-to-date except is needing a mammogram and we have discussed ahead of time since she turns 46 this year about colonoscopy surveillance.  1. History of sinus tachycardia New onset.  Was noted last year.  Has episodic and was fully evaluated with echo and Holter monitor.  Recurrence has been rare and short-lived when they do occur.  We will continue to monitor and have an awareness of caffeine intake.  2. Breast cancer screening by mammogram Discussed with patient and mammogram will be reordered.` - MM 3D SCREEN BREAST BILATERAL

## 2021-09-09 ENCOUNTER — Ambulatory Visit
Admission: RE | Admit: 2021-09-09 | Discharge: 2021-09-09 | Disposition: A | Discharge status: Home / Self Care | Payer: 59 | Source: Ambulatory Visit | Attending: Family Medicine | Admitting: Family Medicine

## 2021-09-09 DIAGNOSIS — Z1231 Encounter for screening mammogram for malignant neoplasm of breast: Secondary | ICD-10-CM | POA: Diagnosis not present

## 2022-09-17 ENCOUNTER — Ambulatory Visit (INDEPENDENT_AMBULATORY_CARE_PROVIDER_SITE_OTHER): Payer: 59 | Admitting: Family Medicine

## 2022-09-17 ENCOUNTER — Other Ambulatory Visit: Payer: Self-pay

## 2022-09-17 ENCOUNTER — Encounter: Payer: Self-pay | Admitting: Family Medicine

## 2022-09-17 VITALS — BP 112/70 | HR 94 | Ht 64.0 in | Wt 148.0 lb

## 2022-09-17 DIAGNOSIS — B379 Candidiasis, unspecified: Secondary | ICD-10-CM | POA: Diagnosis not present

## 2022-09-17 DIAGNOSIS — Z1231 Encounter for screening mammogram for malignant neoplasm of breast: Secondary | ICD-10-CM

## 2022-09-17 DIAGNOSIS — Z1211 Encounter for screening for malignant neoplasm of colon: Secondary | ICD-10-CM

## 2022-09-17 DIAGNOSIS — R5383 Other fatigue: Secondary | ICD-10-CM | POA: Diagnosis not present

## 2022-09-17 DIAGNOSIS — Z0000 Encounter for general adult medical examination without abnormal findings: Secondary | ICD-10-CM

## 2022-09-17 MED ORDER — NYSTATIN 100000 UNIT/GM EX CREA
1.0000 | TOPICAL_CREAM | Freq: Two times a day (BID) | CUTANEOUS | 2 refills | Status: AC
  Filled 2022-09-17: qty 30, 30d supply, fill #0
  Filled 2023-02-16: qty 30, 30d supply, fill #1

## 2022-09-17 NOTE — Progress Notes (Signed)
Date:  09/17/2022   Name:  Jessica Franklin   DOB:  07/08/1975   MRN:  [id number]   Chief Complaint: Annual Exam (No pap)  Patient is a 47 year old female who presents for a comprehensive physical exam. The patient reports the following problems: none. Health maintenance has been reviewed up to date.      Lab Results  Component Value Date   NA 139 01/09/2021   K 3.5 01/09/2021   CO2 25 01/09/2021   GLUCOSE 128 (H) 01/09/2021   BUN 12 01/09/2021   CREATININE 0.73 01/09/2021   CALCIUM 9.3 01/09/2021   GFRNONAA >60 01/09/2021   No results found for: "CHOL", "HDL", "LDLCALC", "LDLDIRECT", "TRIG", "CHOLHDL" No results found for: "TSH" No results found for: "HGBA1C" Lab Results  Component Value Date   WBC 14.4 (H) 01/09/2021   HGB 13.1 01/09/2021   HCT 38.9 01/09/2021   MCV 85.7 01/09/2021   PLT 425 (H) 01/09/2021   No results found for: "ALT", "AST", "GGT", "ALKPHOS", "BILITOT" No results found for: "25OHVITD2", "25OHVITD3", "VD25OH"   Review of Systems  Constitutional:  Negative for chills and fever.  HENT:  Negative for drooling, ear discharge, ear pain and sore throat.   Respiratory:  Negative for cough, shortness of breath and wheezing.   Cardiovascular:  Negative for chest pain, palpitations and leg swelling.  Gastrointestinal:  Negative for abdominal pain, blood in stool, constipation, diarrhea and nausea.  Endocrine: Negative for polydipsia and polyuria.  Genitourinary:  Negative for difficulty urinating, dysuria, frequency, hematuria, menstrual problem and urgency.  Musculoskeletal:  Negative for back pain, myalgias and neck pain.  Skin:  Negative for rash.  Allergic/Immunologic: Negative for environmental allergies.  Neurological:  Negative for dizziness, numbness and headaches.  Hematological:  Negative for adenopathy. Does not bruise/bleed easily.  Psychiatric/Behavioral:  Negative for suicidal ideas. The patient is not nervous/anxious.     There are no  problems to display for this patient.   Allergies  Allergen Reactions   Penicillins Hives    Past Surgical History:  Procedure Laterality Date   TUBAL LIGATION      Social History   Tobacco Use   Smoking status: Never   Smokeless tobacco: Never  Vaping Use   Vaping status: Former   Start date: 01/09/2021  Substance Use Topics   Alcohol use: Yes   Drug use: Not Currently     Medication list has been reviewed and updated.  No outpatient medications have been marked as taking for the 09/17/22 encounter (Office Visit) with Duanne Limerick, MD.       09/17/2022    8:50 AM 08/13/2021    2:51 PM  GAD 7 : Generalized Anxiety Score  Nervous, Anxious, on Edge 0 0  Control/stop worrying 0 0  Worry too much - different things 0 0  Trouble relaxing 0 0  Restless 0 0  Easily annoyed or irritable 0 0  Afraid - awful might happen 0 0  Total GAD 7 Score 0 0  Anxiety Difficulty Not difficult at all Not difficult at all       09/17/2022    8:50 AM 08/13/2021    2:50 PM  Depression screen PHQ 2/9  Decreased Interest 0 0  Down, Depressed, Hopeless 0 0  PHQ - 2 Score 0 0  Altered sleeping 0 0  Tired, decreased energy 0 0  Change in appetite 0 0  Feeling bad or failure about yourself  0 0  Trouble  concentrating 0 0  Moving slowly or fidgety/restless 0 0  Suicidal thoughts 0 0  PHQ-9 Score 0 0  Difficult doing work/chores Not difficult at all Not difficult at all    BP Readings from Last 3 Encounters:  09/17/22 112/70  08/13/21 128/70  01/13/21 137/83    Physical Exam Vitals and nursing note reviewed. Exam conducted with a chaperone present.  Constitutional:      Appearance: Normal appearance. She is well-developed.  HENT:     Head: Normocephalic.     Jaw: There is normal jaw occlusion.     Right Ear: Hearing, tympanic membrane, ear canal and external ear normal.     Left Ear: Hearing, tympanic membrane, ear canal and external ear normal.     Nose: Nose normal. No  congestion or rhinorrhea.     Mouth/Throat:     Lips: Pink.     Mouth: Mucous membranes are moist.     Dentition: Normal dentition.     Tongue: No lesions.     Palate: No mass.     Pharynx: Oropharynx is clear. Uvula midline.  Eyes:     General: Lids are normal. Lids are everted, no foreign bodies appreciated. Vision grossly intact. Gaze aligned appropriately. No scleral icterus.       Right eye: No foreign body, discharge or hordeolum.        Left eye: No foreign body, discharge or hordeolum.     Extraocular Movements:     Right eye: Normal extraocular motion.     Left eye: Normal extraocular motion.     Conjunctiva/sclera: Conjunctivae normal.     Right eye: Right conjunctiva is not injected.     Left eye: Left conjunctiva is not injected.     Pupils: Pupils are equal, round, and reactive to light.  Neck:     Thyroid: No thyroid mass, thyromegaly or thyroid tenderness.     Vascular: Normal carotid pulses. No carotid bruit, hepatojugular reflux or JVD.     Trachea: Trachea normal. No tracheal deviation.  Cardiovascular:     Rate and Rhythm: Normal rate and regular rhythm.     Chest Wall: PMI is not displaced.     Pulses: Normal pulses.          Carotid pulses are 2+ on the right side and 2+ on the left side.      Radial pulses are 2+ on the right side and 2+ on the left side.       Femoral pulses are 2+ on the right side and 2+ on the left side.      Popliteal pulses are 2+ on the right side and 2+ on the left side.       Dorsalis pedis pulses are 2+ on the right side and 2+ on the left side.       Posterior tibial pulses are 2+ on the right side and 2+ on the left side.     Heart sounds: Normal heart sounds, S1 normal and S2 normal. No murmur heard.    No systolic murmur is present.     No diastolic murmur is present.     No friction rub. No gallop. No S3 or S4 sounds.  Pulmonary:     Effort: Pulmonary effort is normal. No respiratory distress.     Breath sounds: Normal  breath sounds. No decreased breath sounds, wheezing, rhonchi or rales.  Chest:  Breasts:    Right: Normal. No swelling, bleeding, inverted nipple, mass, nipple  discharge, skin change or tenderness.     Left: Normal. No swelling, bleeding, inverted nipple, mass, nipple discharge, skin change or tenderness.  Abdominal:     General: Bowel sounds are normal.     Palpations: Abdomen is soft. There is no hepatomegaly, splenomegaly or mass.     Tenderness: There is no abdominal tenderness. There is no guarding or rebound.     Hernia: There is no hernia in the umbilical area or ventral area.  Musculoskeletal:        General: No tenderness. Normal range of motion.     Cervical back: Full passive range of motion without pain, normal range of motion and neck supple.     Right lower leg: No edema.     Left lower leg: No edema.  Lymphadenopathy:     Head:     Right side of head: No submental or submandibular adenopathy.     Left side of head: No submental or submandibular adenopathy.     Cervical: No cervical adenopathy.     Right cervical: No superficial, deep or posterior cervical adenopathy.    Left cervical: No superficial, deep or posterior cervical adenopathy.     Upper Body:     Right upper body: No supraclavicular or axillary adenopathy.     Left upper body: No supraclavicular or axillary adenopathy.  Skin:    General: Skin is warm.     Capillary Refill: Capillary refill takes less than 2 seconds.     Findings: No rash.  Neurological:     Mental Status: She is alert and oriented to person, place, and time.     Cranial Nerves: Cranial nerves 2-12 are intact. No cranial nerve deficit.     Sensory: Sensation is intact.     Motor: Motor function is intact.     Deep Tendon Reflexes: Reflexes normal.  Psychiatric:        Mood and Affect: Mood is not anxious or depressed.        Behavior: Behavior is cooperative.     Wt Readings from Last 3 Encounters:  09/17/22 148 lb (67.1 kg)   08/13/21 148 lb (67.1 kg)  01/09/21 138 lb (62.6 kg)    BP 112/70   Pulse 94   Ht 5\' 4"  (1.626 m)   Wt 148 lb (67.1 kg)   LMP 08/26/2022 (Exact Date)   SpO2 96%   BMI 25.40 kg/m   Assessment and Plan: Jessica Franklin is a 47 y.o. female who presents today for her Complete Annual Exam. She feels well. She reports exercising . She reports she is sleeping fairly well. Immunizations are reviewed and recommendations provided.   Age appropriate screening tests are discussed. Counseling given for risk factor reduction interventions.  1. Annual physical exam Nose subjective/objective concerns noted during history of present illness, past medical history, review of systems and physical exam.  Will obtain labs consisting of lipid panel CMP CBC and TSH. - Lipid Panel With LDL/HDL Ratio - Comprehensive Metabolic Panel (CMET) - CBC with Differential/Platelet - TSH  2. Fatigue, unspecified type Patient with increasing fatigue we will check CBC for anemia and TSH for possible thyroid concern. - CBC with Differential/Platelet - TSH  3. Candidosis Patient brought to my attention she has some erythema beneath her breasts consistent with candidiasis and was prescribed nystatin cream. - nystatin cream (MYCOSTATIN); Apply 1 Application topically 2 (two) times daily.  Dispense: 30 g; Refill: 2  4. Colon cancer screening Discussed with patient and  referral placed for gastroenterology and colon cancer screening. - Ambulatory referral to Gastroenterology  5. Breast cancer screening by mammogram Discussed with patient breast exam was done without abnormality noted.  And will refer for screening mammogram. - MM 3D SCREENING MAMMOGRAM BILATERAL BREAST    Elizabeth Sauer, MD

## 2022-09-18 LAB — COMPREHENSIVE METABOLIC PANEL
ALT: 10 IU/L (ref 0–32)
AST: 14 IU/L (ref 0–40)
Albumin: 4.4 g/dL (ref 3.9–4.9)
Alkaline Phosphatase: 63 IU/L (ref 44–121)
BUN/Creatinine Ratio: 17 (ref 9–23)
BUN: 12 mg/dL (ref 6–24)
Bilirubin Total: 0.4 mg/dL (ref 0.0–1.2)
CO2: 21 mmol/L (ref 20–29)
Calcium: 9 mg/dL (ref 8.7–10.2)
Chloride: 106 mmol/L (ref 96–106)
Creatinine, Ser: 0.69 mg/dL (ref 0.57–1.00)
Globulin, Total: 2.3 g/dL (ref 1.5–4.5)
Glucose: 94 mg/dL (ref 70–99)
Potassium: 4.1 mmol/L (ref 3.5–5.2)
Sodium: 142 mmol/L (ref 134–144)
Total Protein: 6.7 g/dL (ref 6.0–8.5)
eGFR: 108 mL/min/{1.73_m2} (ref >59)

## 2022-09-18 LAB — CBC WITH DIFFERENTIAL/PLATELET
Basophils Absolute: 0 10*3/uL (ref 0.0–0.2)
Basos: 1 %
EOS (ABSOLUTE): 0.2 10*3/uL (ref 0.0–0.4)
Eos: 3 %
Hematocrit: 40.4 % (ref 34.0–46.6)
Hemoglobin: 13.4 g/dL (ref 11.1–15.9)
Immature Grans (Abs): 0 10*3/uL (ref 0.0–0.1)
Immature Granulocytes: 0 %
Lymphocytes Absolute: 1.9 10*3/uL (ref 0.7–3.1)
Lymphs: 30 %
MCH: 28.7 pg (ref 26.6–33.0)
MCHC: 33.2 g/dL (ref 31.5–35.7)
MCV: 87 fL (ref 79–97)
Monocytes Absolute: 0.6 10*3/uL (ref 0.1–0.9)
Monocytes: 9 %
Neutrophils Absolute: 3.6 10*3/uL (ref 1.4–7.0)
Neutrophils: 57 %
Platelets: 390 10*3/uL (ref 150–450)
RBC: 4.67 x10E6/uL (ref 3.77–5.28)
RDW: 12.5 % (ref 11.7–15.4)
WBC: 6.3 10*3/uL (ref 3.4–10.8)

## 2022-09-18 LAB — LIPID PANEL WITH LDL/HDL RATIO
Cholesterol, Total: 152 mg/dL (ref 100–199)
HDL: 47 mg/dL (ref >39)
LDL Chol Calc (NIH): 92 mg/dL (ref 0–99)
LDL/HDL Ratio: 2 ratio (ref 0.0–3.2)
Triglycerides: 64 mg/dL (ref 0–149)
VLDL Cholesterol Cal: 13 mg/dL (ref 5–40)

## 2022-09-18 LAB — TSH: TSH: 1.66 u[IU]/mL (ref 0.450–4.500)

## 2022-09-27 ENCOUNTER — Encounter: Payer: Self-pay | Admitting: *Deleted

## 2022-10-08 ENCOUNTER — Other Ambulatory Visit: Payer: Self-pay

## 2022-10-14 ENCOUNTER — Ambulatory Visit
Admission: RE | Admit: 2022-10-14 | Discharge: 2022-10-14 | Disposition: A | Discharge status: Home / Self Care | Payer: 59 | Source: Ambulatory Visit | Attending: Family Medicine | Admitting: Family Medicine

## 2022-10-14 DIAGNOSIS — Z1231 Encounter for screening mammogram for malignant neoplasm of breast: Secondary | ICD-10-CM | POA: Diagnosis not present

## 2023-07-10 IMAGING — CR DG CHEST 2V
1 series · 2 of 2 positions shown · non-contrast
Comparison: No priors.

CLINICAL DATA: 45-year-old female with history of heart
palpitations.

EXAM:
CHEST - 2 VIEW

[Series 1: dg chest 2 view · 0.14mm/px · 2 of 2 slices shown]
[im 1/2]
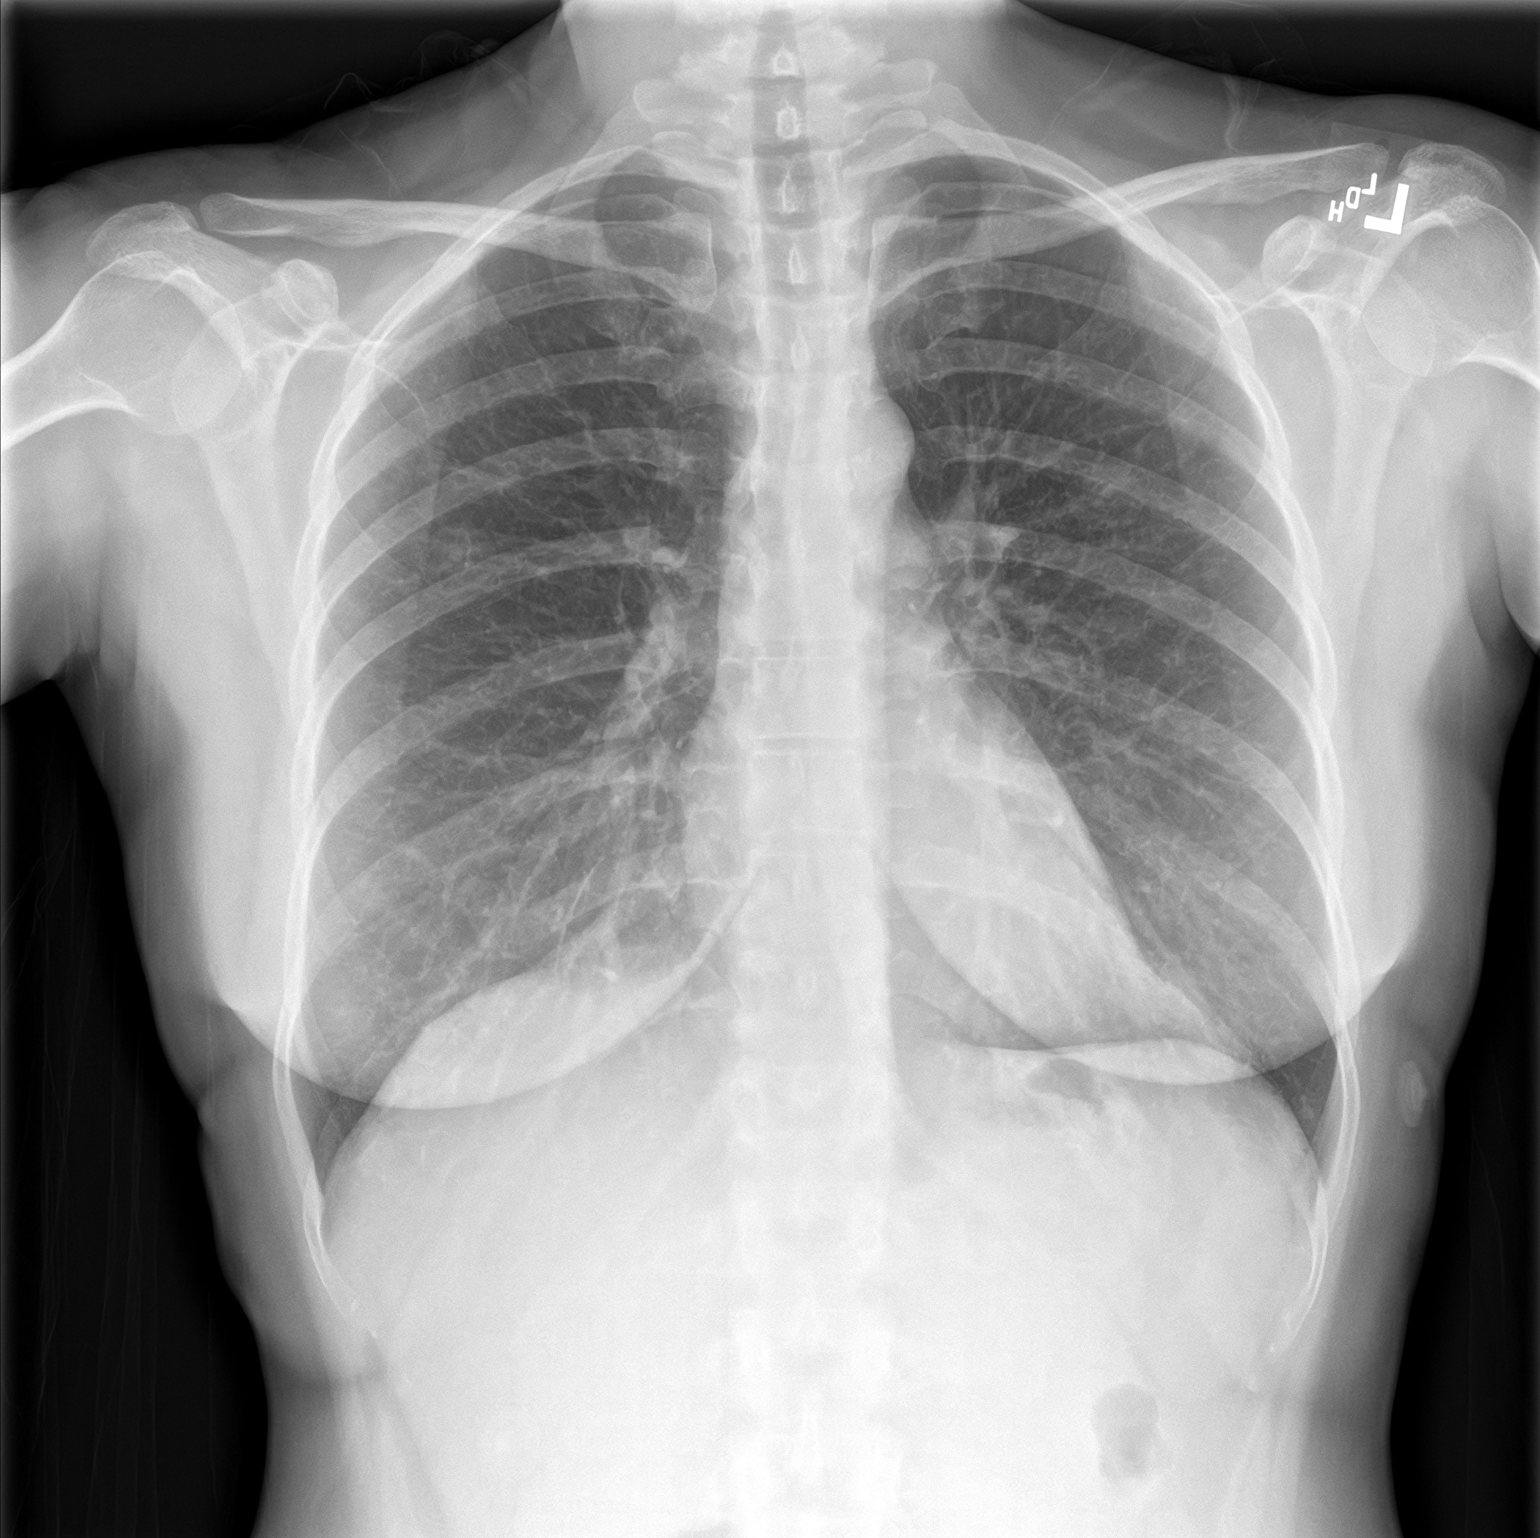
[im 2/2]
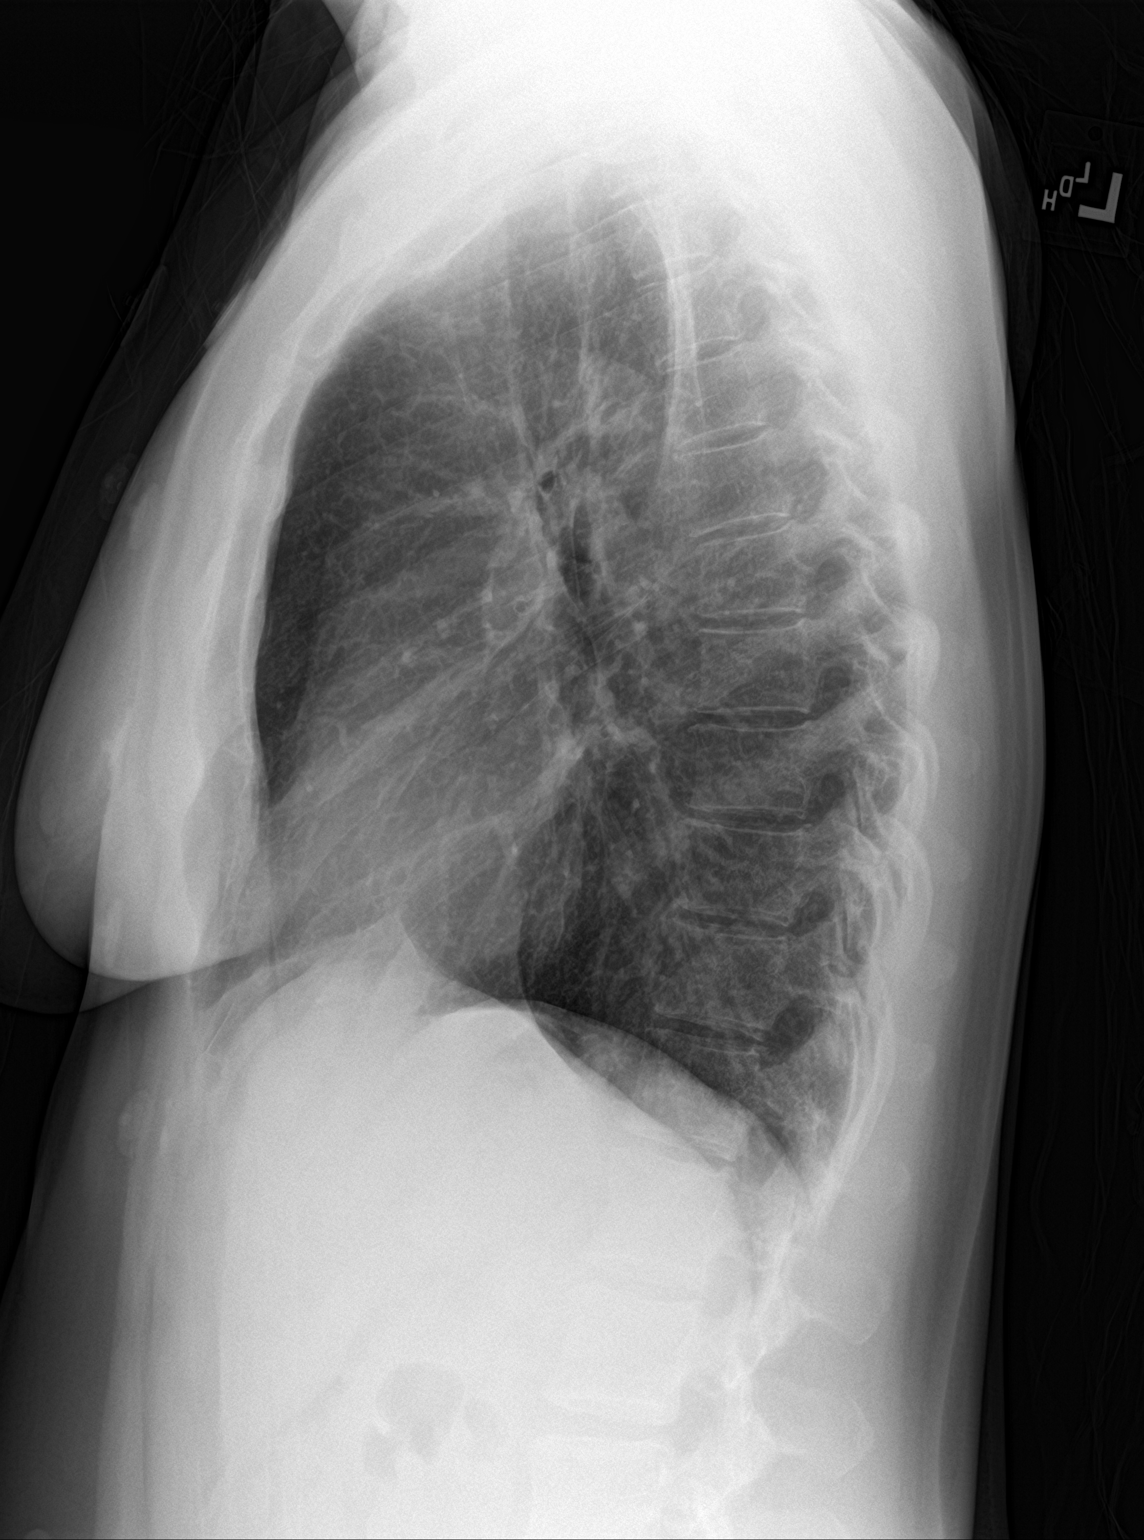

[2 of 2 positions shown; findings below may reference images not displayed]

FINDINGS: Lung volumes are normal. No consolidative airspace disease. No
pleural effusions. No pneumothorax. No pulmonary nodule or mass
noted. Pulmonary vasculature and the cardiomediastinal silhouette
are within normal limits.
IMPRESSION: No radiographic evidence of acute cardiopulmonary disease.

## 2023-09-26 DIAGNOSIS — Z124 Encounter for screening for malignant neoplasm of cervix: Secondary | ICD-10-CM | POA: Diagnosis not present

## 2023-09-26 DIAGNOSIS — Z1339 Encounter for screening examination for other mental health and behavioral disorders: Secondary | ICD-10-CM | POA: Diagnosis not present

## 2023-09-26 DIAGNOSIS — N939 Abnormal uterine and vaginal bleeding, unspecified: Secondary | ICD-10-CM | POA: Diagnosis not present

## 2023-09-26 DIAGNOSIS — Z1331 Encounter for screening for depression: Secondary | ICD-10-CM | POA: Diagnosis not present

## 2023-09-26 DIAGNOSIS — D171 Benign lipomatous neoplasm of skin and subcutaneous tissue of trunk: Secondary | ICD-10-CM | POA: Diagnosis not present

## 2023-09-26 DIAGNOSIS — Z13220 Encounter for screening for lipoid disorders: Secondary | ICD-10-CM | POA: Diagnosis not present

## 2023-09-26 DIAGNOSIS — Z131 Encounter for screening for diabetes mellitus: Secondary | ICD-10-CM | POA: Diagnosis not present

## 2023-09-26 DIAGNOSIS — Z01411 Encounter for gynecological examination (general) (routine) with abnormal findings: Secondary | ICD-10-CM | POA: Diagnosis not present

## 2023-09-26 DIAGNOSIS — Z1321 Encounter for screening for nutritional disorder: Secondary | ICD-10-CM | POA: Diagnosis not present

## 2023-09-26 DIAGNOSIS — Z1231 Encounter for screening mammogram for malignant neoplasm of breast: Secondary | ICD-10-CM | POA: Diagnosis not present

## 2023-09-28 ENCOUNTER — Other Ambulatory Visit: Payer: Self-pay | Admitting: Obstetrics and Gynecology

## 2023-09-28 DIAGNOSIS — Z1231 Encounter for screening mammogram for malignant neoplasm of breast: Secondary | ICD-10-CM

## 2023-10-13 DIAGNOSIS — D171 Benign lipomatous neoplasm of skin and subcutaneous tissue of trunk: Secondary | ICD-10-CM | POA: Diagnosis not present

## 2023-11-02 ENCOUNTER — Ambulatory Visit
Admission: RE | Admit: 2023-11-02 | Discharge: 2023-11-02 | Disposition: A | Discharge status: Home / Self Care | Source: Ambulatory Visit | Attending: Obstetrics and Gynecology | Admitting: Obstetrics and Gynecology

## 2023-11-02 DIAGNOSIS — Z1231 Encounter for screening mammogram for malignant neoplasm of breast: Secondary | ICD-10-CM | POA: Diagnosis not present

## 2023-11-14 ENCOUNTER — Other Ambulatory Visit: Payer: Self-pay | Admitting: Obstetrics and Gynecology

## 2023-11-14 DIAGNOSIS — R928 Other abnormal and inconclusive findings on diagnostic imaging of breast: Secondary | ICD-10-CM

## 2023-11-16 ENCOUNTER — Ambulatory Visit
Admission: RE | Admit: 2023-11-16 | Discharge: 2023-11-16 | Disposition: A | Discharge status: Home / Self Care | Source: Ambulatory Visit | Attending: Obstetrics and Gynecology | Admitting: Obstetrics and Gynecology

## 2023-11-16 DIAGNOSIS — N6322 Unspecified lump in the left breast, upper inner quadrant: Secondary | ICD-10-CM | POA: Diagnosis not present

## 2023-11-16 DIAGNOSIS — N6321 Unspecified lump in the left breast, upper outer quadrant: Secondary | ICD-10-CM | POA: Diagnosis not present

## 2023-11-16 DIAGNOSIS — R928 Other abnormal and inconclusive findings on diagnostic imaging of breast: Secondary | ICD-10-CM

## 2023-11-16 DIAGNOSIS — R92333 Mammographic heterogeneous density, bilateral breasts: Secondary | ICD-10-CM | POA: Diagnosis not present
